# Patient Record
Sex: Female | Born: 1985 | Hispanic: Yes | Marital: Married | Smoking: Never smoker
Health system: Southern US, Community
[De-identification: ages and names within clinical notes are randomized; demographics above are authoritative.]

## PROBLEM LIST (undated history)

## (undated) ENCOUNTER — Emergency Department (HOSPITAL_COMMUNITY): Admission: EM | Payer: Medicaid Other | Source: Home / Self Care

---

## 2012-12-06 ENCOUNTER — Emergency Department (HOSPITAL_COMMUNITY): Payer: Medicaid Other

## 2012-12-06 ENCOUNTER — Encounter (HOSPITAL_COMMUNITY): Payer: Self-pay | Admitting: Emergency Medicine

## 2012-12-06 ENCOUNTER — Emergency Department (HOSPITAL_COMMUNITY)
Admission: EM | Admit: 2012-12-06 | Discharge: 2012-12-06 | Disposition: A | Discharge status: Home / Self Care | Payer: Medicaid Other | Attending: Emergency Medicine | Admitting: Emergency Medicine

## 2012-12-06 DIAGNOSIS — R110 Nausea: Secondary | ICD-10-CM | POA: Insufficient documentation

## 2012-12-06 DIAGNOSIS — R61 Generalized hyperhidrosis: Secondary | ICD-10-CM | POA: Insufficient documentation

## 2012-12-06 DIAGNOSIS — R630 Anorexia: Secondary | ICD-10-CM | POA: Insufficient documentation

## 2012-12-06 DIAGNOSIS — R51 Headache: Secondary | ICD-10-CM | POA: Insufficient documentation

## 2012-12-06 DIAGNOSIS — E049 Nontoxic goiter, unspecified: Secondary | ICD-10-CM | POA: Insufficient documentation

## 2012-12-06 DIAGNOSIS — H539 Unspecified visual disturbance: Secondary | ICD-10-CM | POA: Insufficient documentation

## 2012-12-06 DIAGNOSIS — E010 Iodine-deficiency related diffuse (endemic) goiter: Secondary | ICD-10-CM

## 2012-12-06 DIAGNOSIS — N926 Irregular menstruation, unspecified: Secondary | ICD-10-CM | POA: Insufficient documentation

## 2012-12-06 DIAGNOSIS — Z3202 Encounter for pregnancy test, result negative: Secondary | ICD-10-CM | POA: Insufficient documentation

## 2012-12-06 DIAGNOSIS — R5381 Other malaise: Secondary | ICD-10-CM | POA: Insufficient documentation

## 2012-12-06 DIAGNOSIS — R634 Abnormal weight loss: Secondary | ICD-10-CM

## 2012-12-06 DIAGNOSIS — M542 Cervicalgia: Secondary | ICD-10-CM | POA: Insufficient documentation

## 2012-12-06 DIAGNOSIS — L659 Nonscarring hair loss, unspecified: Secondary | ICD-10-CM | POA: Insufficient documentation

## 2012-12-06 DIAGNOSIS — K5900 Constipation, unspecified: Secondary | ICD-10-CM | POA: Insufficient documentation

## 2012-12-06 DIAGNOSIS — R6889 Other general symptoms and signs: Secondary | ICD-10-CM | POA: Insufficient documentation

## 2012-12-06 LAB — TSH: TSH: 2.077 u[IU]/mL (ref 0.350–4.500)

## 2012-12-06 LAB — COMPREHENSIVE METABOLIC PANEL
ALT: 11 U/L (ref 0–35)
Calcium: 9.7 mg/dL (ref 8.4–10.5)
Creatinine, Ser: 0.67 mg/dL (ref 0.50–1.10)
GFR calc Af Amer: >90 mL/min (ref >90)
Glucose, Bld: 89 mg/dL (ref 70–99)
Sodium: 135 mEq/L (ref 135–145)
Total Protein: 7.6 g/dL (ref 6.0–8.3)

## 2012-12-06 LAB — CBC WITH DIFFERENTIAL/PLATELET
Eosinophils Absolute: 0.1 10*3/uL (ref 0.0–0.7)
Eosinophils Relative: 1 % (ref 0–5)
Lymphs Abs: 2.4 10*3/uL (ref 0.7–4.0)
MCH: 30.9 pg (ref 26.0–34.0)
MCV: 92.7 fL (ref 78.0–100.0)
Monocytes Absolute: 0.4 10*3/uL (ref 0.1–1.0)
Platelets: 197 10*3/uL (ref 150–400)
RDW: 13.4 % (ref 11.5–15.5)

## 2012-12-06 LAB — T4, FREE: Free T4: 1.41 ng/dL (ref 0.80–1.80)

## 2012-12-06 LAB — RAPID HIV SCREEN (WH-MAU): Rapid HIV Screen: NONREACTIVE

## 2012-12-06 LAB — POCT PREGNANCY, URINE: Preg Test, Ur: NEGATIVE

## 2012-12-06 LAB — URINALYSIS, ROUTINE W REFLEX MICROSCOPIC
Bilirubin Urine: NEGATIVE
Hgb urine dipstick: NEGATIVE
Protein, ur: NEGATIVE mg/dL
Urobilinogen, UA: 1 mg/dL (ref 0.0–1.0)

## 2012-12-06 LAB — URINE MICROSCOPIC-ADD ON

## 2012-12-06 NOTE — ED Notes (Signed)
CBG 97 

## 2012-12-06 NOTE — ED Notes (Signed)
Patient transported to X-ray 

## 2012-12-06 NOTE — ED Provider Notes (Signed)
History     CSN: [id number]  Arrival date & time 12/06/12  1440   First MD Initiated Contact with Patient 12/06/12 1540      Chief Complaint  Patient presents with  . Sore Throat  . Night Sweats  . Weight Loss    (Consider location/radiation/quality/duration/timing/severity/associated sxs/prior treatment) Patient is a 26 y.o. female presenting with pharyngitis. The history is provided by the patient and medical records.  Sore Throat Associated symptoms include fatigue, nausea, neck pain and a sore throat. Pertinent negatives include no abdominal pain, arthralgias, chest pain, coughing, diaphoresis, fever, headaches, myalgias, numbness, rash, vomiting or weakness.    Michelle Best is a 26 y.o. female  with No PMHx presents to the Emergency Department complaining of gradual, persistent, progressively worsening night sweats, weight loss onset more than 2 lbs. Associated symptoms include night sweats nightly, 23 lb weight loss in 2 mos, sore throat x 1 week, hair loss, headaches in the mornings, nausea, mild constipation, cold intolerance, fatigue, anorexia.  Nothing makes it better and nothing makes it worse.  Pt denies fever, chills,  Chest pain, shortness of breath, cough, abdominal pain, vomiting, diarrhea, palpitations, weakness, numbness, tingling, loss of sensation, rashes, dysuria, hematuria, urgency, frequency, melena, BRBPR, polydipsia, polyphagia.  Pt states menstrual cycle was very regular until October 2013 when it became irregular and heavier than previously. G2P2.  Negative STD and HIV screens during her pregnancies.  Pt is not taking a birth control.  Pt traveled outside the [place] at the end of September to the [place] and stayed at a resort there.      History reviewed. No pertinent past medical history.  History reviewed. No pertinent past surgical history.  No family history on file.  History  Substance Use Topics  . Smoking status: Never Smoker   .  Smokeless tobacco: Never Used  . Alcohol Use: No    OB History    Grav Para Term Preterm Abortions TAB SAB Ect Mult Living                  Review of Systems  Constitutional: Positive for appetite change, fatigue and unexpected weight change. Negative for fever and diaphoresis.  HENT: Positive for sore throat and neck pain. Negative for mouth sores and neck stiffness.   Eyes: Positive for visual disturbance (decreased visual acuity ).  Respiratory: Negative for cough, chest tightness, shortness of breath and wheezing.   Cardiovascular: Negative for chest pain.  Gastrointestinal: Positive for nausea. Negative for vomiting, abdominal pain, diarrhea and constipation.  Genitourinary: Positive for menstrual problem (irregularity). Negative for dysuria, urgency, frequency, hematuria, decreased urine volume, vaginal bleeding, vaginal discharge, difficulty urinating and vaginal pain.  Musculoskeletal: Negative for myalgias, back pain and arthralgias.  Skin: Negative for rash.  Neurological: Negative for syncope, weakness, light-headedness, numbness and headaches.  Hematological: Negative for adenopathy. Does not bruise/bleed easily.  Psychiatric/Behavioral: Negative for confusion and sleep disturbance. The patient is not nervous/anxious.   All other systems reviewed and are negative.    Allergies  Review of patient's allergies indicates no known allergies.  Home Medications  No current outpatient prescriptions on file.  BP 109/67  Pulse 70  Temp 98.5 F (36.9 C) (Oral)  Resp 14  Ht 5\' 5"  (1.651 m)  Wt 106 lb (48.081 kg)  BMI 17.64 kg/m2  SpO2 99%  LMP 11/18/2012  Physical Exam  Nursing note and vitals reviewed. Constitutional: She is oriented to person, place, and time. She appears well-developed and well-nourished.  No distress.  HENT:  Head: Normocephalic and atraumatic.  Right Ear: Tympanic membrane, external ear and ear canal normal.  Left Ear: Tympanic membrane, external  ear and ear canal normal.  Nose: Nose normal. No mucosal edema or rhinorrhea. Right sinus exhibits no maxillary sinus tenderness and no frontal sinus tenderness. Left sinus exhibits no maxillary sinus tenderness and no frontal sinus tenderness.  Mouth/Throat: Uvula is midline, oropharynx is clear and moist and mucous membranes are normal. Mucous membranes are not pale, not dry and not cyanotic. No oral lesions. No uvula swelling. No oropharyngeal exudate, posterior oropharyngeal edema, posterior oropharyngeal erythema or tonsillar abscesses.  Eyes: Conjunctivae normal and EOM are normal. Pupils are equal, round, and reactive to light. No scleral icterus.  Neck: Normal range of motion, full passive range of motion without pain and phonation normal. Neck supple. Normal carotid pulses, no hepatojugular reflux and no JVD present. No tracheal tenderness, no spinous process tenderness and no muscular tenderness present. Carotid bruit is not present. No rigidity. No tracheal deviation, no edema, no erythema and normal range of motion present. No Brudzinski's sign and no Kernig's sign noted. Thyromegaly present. No mass present.  Cardiovascular: Normal rate, regular rhythm, S1 normal, S2 normal, normal heart sounds and intact distal pulses.  PMI is not displaced.   No murmur heard. Pulses:      Radial pulses are 2+ on the right side, and 2+ on the left side.       Dorsalis pedis pulses are 2+ on the right side, and 2+ on the left side.       Posterior tibial pulses are 2+ on the right side, and 2+ on the left side.  Pulmonary/Chest: Effort normal and breath sounds normal. No accessory muscle usage or stridor. Not tachypneic. No respiratory distress. She has no decreased breath sounds. She has no wheezes. She has no rhonchi. She has no rales.  Abdominal: Soft. Normal appearance and bowel sounds are normal. She exhibits no distension, no abdominal bruit, no pulsatile midline mass and no mass. There is no  hepatosplenomegaly. There is no tenderness. There is no rigidity, no rebound, no guarding, no CVA tenderness, no tenderness at McBurney's point and negative Murphy's sign.  Musculoskeletal: Normal range of motion. She exhibits no edema and no tenderness.  Neurological: She is alert and oriented to person, place, and time. She has normal reflexes. She displays normal reflexes. No cranial nerve deficit. She exhibits normal muscle tone. Coordination normal.       Speech is clear and goal oriented, follows commands Major Cranial nerves without deficit, Normal strength in upper and lower extremities bilaterally including dorsiflexion and plantar flexion, strong and equal grip strength Sensation normal to light and sharp touch Moves extremities without ataxia, coordination intact Normal finger to nose and rapid alternating movements Normal gait Normal heel-shin and balance  Skin: Skin is warm and dry. No rash noted. She is not diaphoretic. No erythema. No pallor.  Psychiatric: She has a normal mood and affect. Her speech is normal and behavior is normal. Thought content normal. Her mood appears not anxious.    ED Course  Procedures (including critical care time)  Labs Reviewed  URINALYSIS, ROUTINE W REFLEX MICROSCOPIC - Abnormal; Notable for the following:    Leukocytes, UA SMALL (*)     All other components within normal limits  COMPREHENSIVE METABOLIC PANEL - Abnormal; Notable for the following:    Total Bilirubin 0.2 (*)     All other components within normal limits  CBC WITH DIFFERENTIAL  RAPID HIV SCREEN ([facility])  POCT PREGNANCY, URINE  GLUCOSE, CAPILLARY  URINE MICROSCOPIC-ADD ON  TSH  T4, FREE   Dg Chest 2 View  12/06/2012  *RADIOLOGY REPORT*  Clinical Data: Weight loss, night sweats, International travel  CHEST - 2 VIEW  Comparison: None.  Findings: The lungs are well-aerated and free from pulmonary edema, focal airspace consolidation or pulmonary nodule.  Cardiac and mediastinal  contours are within normal limits.  Specifically, no evidence of mediastinal or hilar adenopathy by conventional radiography.  No pneumothorax, or pleural effusion.  No acute osseous findings.  IMPRESSION:  Normal chest x-ray.  Specifically, no evidence of mediastinal or hilar adenopathy by conventional radiography.   Original Report Authenticated By: Malachy Moan, M.D.      1. Thyromegaly   2. Weight loss   3. Night sweats   4. Anorexia       MDM  Tayra Dawe is with complaints of anorexia, night sweats, weight loss, menstrual cycle disruption all after international travel.  Concern for thyroid disorder patient also endorses or loss but does not endorse palpitations.  Also concern for diabetes the patient does not endorse polydipsia polyuria or polyphagia.  Concern for TB and HIV the patient was tested during her pregnancies and was negative;  1 sexual partner and has no history of IV drug use.  Also concern for malignancy as patient is endorsing night sweats and other B cell symptoms. Will obtain labwork and imaging.  Rapid HIV screen negative, CMP unremarkable, urine unremarkable, CBC unremarkable, CBG 97 and pregnancy test negative.  Chest x-ray without evidence of pneumothorax pleural effusion or mediastinal/hilar adenopathy.  This time is no evidence for diabetes, HIV, TB or malignancy.  TSH and free T4 still processing.  Discussed at length the need for followup with the patient and she is to find a primary care physician.  Will notify her of her thyroid results.  1. Medications: usual home medications 2. Treatment: rest, drink plenty of fluids, eat if you can 3. Follow Up: Please followup with your primary doctor for discussion of your diagnoses and further evaluation after today's visit; if you do not have a primary care doctor use the resource guide provided to find one       Dierdre Forth, PA-C 12/06/12 1802

## 2012-12-06 NOTE — ED Provider Notes (Signed)
Medical screening examination/treatment/procedure(s) were performed by non-physician practitioner and as supervising physician I was immediately available for consultation/collaboration.  Hyrum Shaneyfelt T Wiktoria Hemrick, MD 12/06/12 2209 

## 2012-12-06 NOTE — ED Notes (Signed)
Pt presents w/ persistant sore throat, night sweats, states she has lost 18 lbs during this period of time. Wakes during noc and has to put on dry clothes. Tired all the time and states her hair is starting to fall out.

## 2013-03-30 ENCOUNTER — Encounter (HOSPITAL_COMMUNITY): Payer: Self-pay | Admitting: Emergency Medicine

## 2013-03-30 ENCOUNTER — Emergency Department (HOSPITAL_COMMUNITY)
Admission: EM | Admit: 2013-03-30 | Discharge: 2013-03-30 | Disposition: A | Discharge status: Home / Self Care | Payer: Medicaid Other | Attending: Emergency Medicine | Admitting: Emergency Medicine

## 2013-03-30 DIAGNOSIS — W57XXXA Bitten or stung by nonvenomous insect and other nonvenomous arthropods, initial encounter: Secondary | ICD-10-CM | POA: Insufficient documentation

## 2013-03-30 DIAGNOSIS — Y92009 Unspecified place in unspecified non-institutional (private) residence as the place of occurrence of the external cause: Secondary | ICD-10-CM | POA: Insufficient documentation

## 2013-03-30 DIAGNOSIS — Y9389 Activity, other specified: Secondary | ICD-10-CM | POA: Insufficient documentation

## 2013-03-30 DIAGNOSIS — L089 Local infection of the skin and subcutaneous tissue, unspecified: Secondary | ICD-10-CM

## 2013-03-30 MED ORDER — HYDROCORTISONE 1 % EX CREA: TOPICAL_CREAM | CUTANEOUS | Status: DC

## 2013-03-30 MED ORDER — SULFAMETHOXAZOLE-TRIMETHOPRIM 800-160 MG PO TABS: 1.0000 | ORAL_TABLET | Freq: Two times a day (BID) | ORAL | Status: DC

## 2013-03-30 NOTE — ED Notes (Signed)
Lt leg noticed a small read bump apporx 5 days ago and then it became worse and causing pain to leg.

## 2013-03-30 NOTE — ED Notes (Signed)
Pt complains of "a bug bit me about 4 days ago and now it is spreading"

## 2013-03-30 NOTE — ED Provider Notes (Signed)
History     CSN: [id number]  Arrival date & time 03/30/13  1339   First MD Initiated Contact with Patient 03/30/13 1433      Chief Complaint  Patient presents with  . Insect Bite    (Consider location/radiation/quality/duration/timing/severity/associated sxs/prior treatment) HPI  Patient to the ER for rash to left knee. She feels as though she was bite by a spider. She has multiple tiny red dots and describes them as being really painful. The incident happened 4 days ago and she woke up with them. She shares a bed with her husband who does not have any bites nor her kids. She does not see any spiders in her bed. No other areas have been affected. No loss of function of her leg. Fevers, n,v,d, weakness, headaches. nad vss  History reviewed. No pertinent past medical history.  History reviewed. No pertinent past surgical history.  No family history on file.  History  Substance Use Topics  . Smoking status: Never Smoker   . Smokeless tobacco: Never Used  . Alcohol Use: No    OB History   Grav Para Term Preterm Abortions TAB SAB Ect Mult Living                  Review of Systems  All other systems reviewed and are negative.    Allergies  Review of patient's allergies indicates no known allergies.  Home Medications   Current Outpatient Rx  Name  Route  Sig  Dispense  Refill  . ibuprofen (ADVIL,MOTRIN) 200 MG tablet   Oral   Take 200 mg by mouth every 6 (six) hours as needed (menstrual cramps.).         Marland Kitchen Polyethyl Glycol-Propyl Glycol (SYSTANE OP)   Ophthalmic   Apply 1 drop to eye daily as needed (dry eyes.).         Marland Kitchen hydrocortisone cream 1 %      Apply to affected area 2 times daily   15 g   0   . sulfamethoxazole-trimethoprim (SEPTRA DS) 800-160 MG per tablet   Oral   Take 1 tablet by mouth every 12 (twelve) hours.   14 tablet   0     BP 94/63  Pulse 88  Temp(Src) 97.5 F (36.4 C) (Oral)  Resp 18  SpO2 99%  Physical Exam  Nursing note  and vitals reviewed. Constitutional: She appears well-developed and well-nourished. No distress.  HENT:  Head: Normocephalic and atraumatic.  Eyes: Pupils are equal, round, and reactive to light.  Neck: Normal range of motion. Neck supple.  Cardiovascular: Normal rate and regular rhythm.   Pulmonary/Chest: Effort normal.  Abdominal: Soft.  Neurological: She is alert.  Skin: Skin is warm and dry.       ED Course  Procedures (including critical care time)  Labs Reviewed - No data to display No results found.   1. Insect bite of leg, left, infected, initial encounter       MDM  abx and hydrocortisone cream.  Referral to Derm.  Pt has been advised of the symptoms that warrant their return to the ED. Patient has voiced understanding and has agreed to follow-up with the PCP or specialist.         Dorthula Matas, PA-C 03/30/13 1515

## 2013-04-01 NOTE — ED Provider Notes (Signed)
Medical screening examination/treatment/procedure(s) were performed by non-physician practitioner and as supervising physician I was immediately available for consultation/collaboration.   Suzi Roots, MD 04/01/13 2151

## 2013-05-09 ENCOUNTER — Encounter (HOSPITAL_COMMUNITY): Payer: Self-pay | Admitting: Emergency Medicine

## 2013-05-09 ENCOUNTER — Emergency Department (HOSPITAL_COMMUNITY)
Admission: EM | Admit: 2013-05-09 | Discharge: 2013-05-09 | Disposition: A | Discharge status: Home / Self Care | Payer: Medicaid Other | Attending: Emergency Medicine | Admitting: Emergency Medicine

## 2013-05-09 ENCOUNTER — Telehealth (HOSPITAL_COMMUNITY): Payer: Self-pay | Admitting: Emergency Medicine

## 2013-05-09 DIAGNOSIS — R630 Anorexia: Secondary | ICD-10-CM | POA: Insufficient documentation

## 2013-05-09 DIAGNOSIS — R42 Dizziness and giddiness: Secondary | ICD-10-CM | POA: Insufficient documentation

## 2013-05-09 DIAGNOSIS — R112 Nausea with vomiting, unspecified: Secondary | ICD-10-CM | POA: Insufficient documentation

## 2013-05-09 MED ORDER — MECLIZINE HCL 50 MG PO TABS: 50.0000 mg | ORAL_TABLET | Freq: Three times a day (TID) | ORAL | Status: DC | PRN

## 2013-05-09 NOTE — ED Provider Notes (Signed)
History     CSN: [id number]  Arrival date & time 05/09/13  1550   First MD Initiated Contact with Patient 05/09/13 1649      Chief Complaint  Patient presents with  . Dizziness    (Consider location/radiation/quality/duration/timing/severity/associated sxs/prior treatment) HPI Patient complaining of vertigo since Wednesday.  States feels like staggering.  Denies injury, states had headache last night but none when started or today.  States began when watching tv Wednesday.  Noted living room spinning when she tried to walk.  Patient states nausea and vomited four times Thursday, none Friday, today "dry heaving"  States taking water and gingerale but states hasn't eaten since yesterday due to poor appetite.  Denies ear pain, nasal congestion visual changes or lateralized weakness.    History reviewed. No pertinent past medical history.  History reviewed. No pertinent past surgical history.  History reviewed. No pertinent family history.  History  Substance Use Topics  . Smoking status: Never Smoker   . Smokeless tobacco: Never Used  . Alcohol Use: No    OB History   Grav Para Term Preterm Abortions TAB SAB Ect Mult Living                  Review of Systems  All other systems reviewed and are negative.    Allergies  Review of patient's allergies indicates no known allergies.  Home Medications  No current outpatient prescriptions on file.  BP 119/73  Pulse 83  Temp(Src) 98.6 F (37 C) (Oral)  Resp 20  SpO2 96%  LMP 04/26/2013  Physical Exam  Nursing note and vitals reviewed. Constitutional: She is oriented to person, place, and time. She appears well-developed and well-nourished.  HENT:  Head: Normocephalic and atraumatic.  Right Ear: Tympanic membrane and external ear normal.  Left Ear: Tympanic membrane and external ear normal.  Nose: Nose normal. Right sinus exhibits no maxillary sinus tenderness and no frontal sinus tenderness. Left sinus exhibits no  maxillary sinus tenderness and no frontal sinus tenderness.  Eyes: Conjunctivae and EOM are normal. Pupils are equal, round, and reactive to light. Right eye exhibits no nystagmus. Left eye exhibits no nystagmus.  Neck: Normal range of motion. Neck supple.  Cardiovascular: Normal rate, regular rhythm, normal heart sounds and intact distal pulses.   Pulmonary/Chest: Effort normal and breath sounds normal. No respiratory distress. She exhibits no tenderness.  Abdominal: Soft. Bowel sounds are normal. She exhibits no distension and no mass. There is no tenderness.  Musculoskeletal: Normal range of motion. She exhibits no edema and no tenderness.  Neurological: She is alert and oriented to person, place, and time. She has normal strength and normal reflexes. No sensory deficit. She displays a negative Romberg sign. GCS eye subscore is 4. GCS verbal subscore is 5. GCS motor subscore is 6.  Reflex Scores:      Tricep reflexes are 2+ on the right side and 2+ on the left side.      Bicep reflexes are 2+ on the right side and 2+ on the left side.      Brachioradialis reflexes are 2+ on the right side and 2+ on the left side.      Patellar reflexes are 2+ on the right side and 2+ on the left side.      Achilles reflexes are 2+ on the right side and 2+ on the left side. Patient with normal gait without ataxia, shuffling, spasm, or antalgia. Speech is normal without dysarthria, dysphasia, or aphasia. Muscle strength  is 5/5 in bilateral shoulders, elbow flexor and extensors, wrist flexor and extensors, and intrinsic hand muscles. 5/5 bilateral lower extremity hip flexors, extensors, knee flexors and extensors, and ankle dorsi and plantar flexors.    Skin: Skin is warm and dry. No rash noted.  Psychiatric: She has a normal mood and affect. Her behavior is normal. Judgment and thought content normal.    ED Course  Procedures (including critical care time)  Labs Reviewed - No data to display No results  found.   No diagnosis found.    MDM  lmp 5/2 lighter than usual g2p2  Discussed vertigo with patient and to follow up with pmd.  Plan antivert       Michelle Quarry, MD 05/09/13 [phone]

## 2013-05-09 NOTE — ED Notes (Signed)
She c/o waxing/waning vertigo with nausea with some vomiting (Thurs.), plus "dry-heaving" today.  She is in no distress.

## 2013-09-14 ENCOUNTER — Emergency Department (HOSPITAL_COMMUNITY)
Admission: EM | Admit: 2013-09-14 | Discharge: 2013-09-14 | Disposition: A | Discharge status: Home / Self Care | Payer: Medicaid Other | Attending: Emergency Medicine | Admitting: Emergency Medicine

## 2013-09-14 ENCOUNTER — Encounter (HOSPITAL_COMMUNITY): Payer: Self-pay

## 2013-09-14 DIAGNOSIS — R5383 Other fatigue: Secondary | ICD-10-CM

## 2013-09-14 DIAGNOSIS — R61 Generalized hyperhidrosis: Secondary | ICD-10-CM | POA: Insufficient documentation

## 2013-09-14 DIAGNOSIS — R630 Anorexia: Secondary | ICD-10-CM

## 2013-09-14 DIAGNOSIS — R5381 Other malaise: Secondary | ICD-10-CM | POA: Insufficient documentation

## 2013-09-14 DIAGNOSIS — Z3202 Encounter for pregnancy test, result negative: Secondary | ICD-10-CM | POA: Insufficient documentation

## 2013-09-14 LAB — CBC WITH DIFFERENTIAL/PLATELET
Basophils Absolute: 0 10*3/uL (ref 0.0–0.1)
Basophils Relative: 0 % (ref 0–1)
Eosinophils Absolute: 0 10*3/uL (ref 0.0–0.7)
Eosinophils Relative: 1 % (ref 0–5)
HCT: 41.1 % (ref 36.0–46.0)
Hemoglobin: 14.1 g/dL (ref 12.0–15.0)
MCH: 32.2 pg (ref 26.0–34.0)
MCHC: 34.3 g/dL (ref 30.0–36.0)
MCV: 93.8 fL (ref 78.0–100.0)
Monocytes Absolute: 0.4 10*3/uL (ref 0.1–1.0)
Monocytes Relative: 7 % (ref 3–12)
RDW: 13.1 % (ref 11.5–15.5)

## 2013-09-14 LAB — POCT I-STAT, CHEM 8
BUN: 9 mg/dL (ref 6–23)
Chloride: 110 mEq/L (ref 96–112)
Sodium: 143 mEq/L (ref 135–145)
TCO2: 23 mmol/L (ref 0–100)

## 2013-09-14 LAB — URINALYSIS, ROUTINE W REFLEX MICROSCOPIC
Glucose, UA: NEGATIVE mg/dL
Leukocytes, UA: NEGATIVE
Protein, ur: NEGATIVE mg/dL
pH: 5 (ref 5.0–8.0)

## 2013-09-14 LAB — URINE MICROSCOPIC-ADD ON

## 2013-09-14 LAB — POCT PREGNANCY, URINE: Preg Test, Ur: NEGATIVE

## 2013-09-14 NOTE — ED Provider Notes (Signed)
CSN: [id number]     Arrival date & time 09/14/13  1526 History   First MD Initiated Contact with Patient 09/14/13 1622     Chief Complaint  Patient presents with  . Fatigue  . Night Sweats   (Consider location/radiation/quality/duration/timing/severity/associated sxs/prior Treatment) HPI Comments: Patient presents with a chief complaint of fatigue.  She reports that she has been feeling fatigued for the past 2-3 years.  She also reports that she has had intermittent night sweats over the past 2-3 years.  She states that she has seen 4-5 different physician regarding this in the past, but has not been given a diagnosis.  She states that at one time her thyroid was checked and the TSH was elevated.  However, they rechecked it and it was normal.  She is currently not on any thyroid medications at this time.  She denies cough, fever, chills, nausea, vomiting, abdominal pain, chest pain, or SOB at this time.  Denies any pain at this time.  She is not taking anything for her symptoms.    The history is provided by the patient.    History reviewed. No pertinent past medical history. History reviewed. No pertinent past surgical history. No family history on file. History  Substance Use Topics  . Smoking status: Never Smoker   . Smokeless tobacco: Never Used  . Alcohol Use: No   OB History   Grav Para Term Preterm Abortions TAB SAB Ect Mult Living                 Review of Systems  Constitutional: Positive for appetite change and fatigue.  All other systems reviewed and are negative.    Allergies  Review of patient's allergies indicates no known allergies.  Home Medications   Current Outpatient Rx  Name  Route  Sig  Dispense  Refill  . ibuprofen (ADVIL,MOTRIN) 200 MG tablet   Oral   Take 200 mg by mouth every 6 (six) hours as needed for pain.          BP 115/76  Pulse 98  Temp(Src) 97.3 F (36.3 C) (Oral)  Resp 20  SpO2 100%  LMP 09/07/2013 Physical Exam  Nursing note  and vitals reviewed. Constitutional: She is oriented to person, place, and time. She appears well-developed and well-nourished.  Non-toxic appearance. She does not have a sickly appearance. She does not appear ill.  HENT:  Head: Normocephalic and atraumatic.  Mouth/Throat: Oropharynx is clear and moist.  Eyes: EOM are normal. Pupils are equal, round, and reactive to light.  Neck: Normal range of motion. Neck supple.  Cardiovascular: Normal rate, regular rhythm and normal heart sounds.   Pulmonary/Chest: Effort normal and breath sounds normal. No respiratory distress. She has no wheezes. She has no rales.  Abdominal: Soft. Bowel sounds are normal. She exhibits no distension. There is no tenderness. There is no rebound and no guarding.  Musculoskeletal: Normal range of motion.  Neurological: She is alert and oriented to person, place, and time. She has normal strength. No cranial nerve deficit or sensory deficit. Coordination normal.  Skin: Skin is warm and dry.  Psychiatric: She has a normal mood and affect.    ED Course  Procedures (including critical care time) Labs Review Labs Reviewed  POCT I-STAT, CHEM 8 - Abnormal; Notable for the following:    Calcium, Ion 1.26 (*)    Hemoglobin 15.3 (*)    All other components within normal limits  CBC WITH DIFFERENTIAL  URINALYSIS, ROUTINE W REFLEX  MICROSCOPIC  POCT PREGNANCY, URINE   Imaging Review No results found.  MDM  No diagnosis found. Patient presenting with a chief complaint of fatigue that has been present for the past 2-3 years.  She states that she has seen 4-5 physicians for this in the past, but has not been given a diagnosis.  Patient appears well and non toxic on exam.  Vital signs stable.  Physical exam unremarkable.  Labs unremarkable.  Feel that the patient is stable for discharge.  Patient instructed to follow up with PCP.  Return precautions given.      Pascal Lux [place], PA-C 09/16/13 0002

## 2013-09-14 NOTE — Progress Notes (Signed)
[facility] ED CM noted pt with medicaid coverage but no pcp listed Spoke with pt who confirms no pcp  [facility] ED CM spoke with pt on how to obtain an in network pcp with insurance coverage via the customer service number or web site     CM discussed with pt the need to verify with DSS who is pcp is, how to request a change of medicaid pcp and process for changing medicaid from county to county and from state to state Pt voiced understanding   CM provided pt with a list of [place] accepting [facility] pcps Pt appreciative of resources

## 2013-09-14 NOTE — ED Notes (Signed)
Pt c/o fatigue, night sweats, decreased appetite for "years", states was told she may have thyroid dx

## 2013-09-16 NOTE — ED Provider Notes (Signed)
Medical screening examination/treatment/procedure(s) were performed by non-physician practitioner and as supervising physician I was immediately available for consultation/collaboration.  Shon Baton, MD 09/16/13 1416

## 2015-03-11 ENCOUNTER — Emergency Department (HOSPITAL_BASED_OUTPATIENT_CLINIC_OR_DEPARTMENT_OTHER): Payer: Medicaid Other

## 2015-03-11 ENCOUNTER — Emergency Department (HOSPITAL_BASED_OUTPATIENT_CLINIC_OR_DEPARTMENT_OTHER)
Admission: EM | Admit: 2015-03-11 | Discharge: 2015-03-11 | Disposition: A | Discharge status: Home / Self Care | Payer: Medicaid Other | Attending: Emergency Medicine | Admitting: Emergency Medicine

## 2015-03-11 ENCOUNTER — Encounter (HOSPITAL_BASED_OUTPATIENT_CLINIC_OR_DEPARTMENT_OTHER): Payer: Self-pay

## 2015-03-11 DIAGNOSIS — Y92410 Unspecified street and highway as the place of occurrence of the external cause: Secondary | ICD-10-CM | POA: Insufficient documentation

## 2015-03-11 DIAGNOSIS — S0990XA Unspecified injury of head, initial encounter: Secondary | ICD-10-CM | POA: Insufficient documentation

## 2015-03-11 DIAGNOSIS — Y998 Other external cause status: Secondary | ICD-10-CM | POA: Insufficient documentation

## 2015-03-11 DIAGNOSIS — S8011XA Contusion of right lower leg, initial encounter: Secondary | ICD-10-CM | POA: Diagnosis not present

## 2015-03-11 DIAGNOSIS — Y9389 Activity, other specified: Secondary | ICD-10-CM | POA: Insufficient documentation

## 2015-03-11 DIAGNOSIS — S8991XA Unspecified injury of right lower leg, initial encounter: Secondary | ICD-10-CM | POA: Diagnosis present
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
## 2015-03-11 NOTE — ED Notes (Signed)
Report received from Marva Simms, care assumed. 

## 2015-03-11 NOTE — Discharge Instructions (Signed)
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
## 2016-10-25 ENCOUNTER — Encounter (HOSPITAL_BASED_OUTPATIENT_CLINIC_OR_DEPARTMENT_OTHER): Payer: Self-pay | Admitting: *Deleted

## 2016-10-25 ENCOUNTER — Emergency Department (HOSPITAL_BASED_OUTPATIENT_CLINIC_OR_DEPARTMENT_OTHER)
Admission: EM | Admit: 2016-10-25 | Discharge: 2016-10-25 | Disposition: A | Discharge status: Home / Self Care | Payer: Medicaid Other | Attending: Emergency Medicine | Admitting: Emergency Medicine

## 2016-10-25 DIAGNOSIS — L03317 Cellulitis of buttock: Secondary | ICD-10-CM | POA: Diagnosis not present

## 2016-10-25 DIAGNOSIS — L0231 Cutaneous abscess of buttock: Secondary | ICD-10-CM | POA: Diagnosis present

## 2016-10-25 MED ORDER — LIDOCAINE-EPINEPHRINE 1 %-1:100000 IJ SOLN: 10.0000 mL | Freq: Once | INTRAMUSCULAR | Status: DC

## 2016-10-25 MED ORDER — SULFAMETHOXAZOLE-TRIMETHOPRIM 800-160 MG PO TABS
1.0000 | ORAL_TABLET | Freq: Two times a day (BID) | ORAL | 0 refills | Status: DC
Start: 2016-10-25 — End: 2018-09-20

## 2016-10-25 MED ORDER — IBUPROFEN 800 MG PO TABS: 800.0000 mg | ORAL_TABLET | Freq: Three times a day (TID) | ORAL | 0 refills | Status: DC | PRN

## 2016-10-25 MED ORDER — LIDOCAINE-EPINEPHRINE (PF) 2 %-1:200000 IJ SOLN
10.0000 mL | Freq: Once | INTRAMUSCULAR | Status: AC
  Administered 2016-10-25: 10 mL via INTRADERMAL
  Filled 2016-10-25: qty 10

## 2016-10-25 NOTE — ED Provider Notes (Signed)
[facility]   CSN: [id number] Arrival date & time: 10/25/16  1044     History   Chief Complaint Chief Complaint  Patient presents with  . Abscess    HPI Michelle Best is a 30 y.o. female.  HPI   Patient presents with 4 days of right buttock pain and growing lesion.  States the lesion started out as a red dot that has gradually increased in size, "feels like there's something in there."  The pain radiates throughout her right buttock.  Has been putting skin cream on it without improvement.  Has also tried warm compresses.  Denies drainage.  Denies fevers, chills, myalgias, N/V, abdominal pain, bowel or bladder changes.    History reviewed. No pertinent past medical history.  There are no active problems to display for this patient.   History reviewed. No pertinent surgical history.  OB History    No data available       Home Medications    Prior to Admission medications   Medication Sig Start Date End Date Taking? Authorizing Provider  ibuprofen (ADVIL,MOTRIN) 800 MG tablet Take 1 tablet (800 mg total) by mouth every 8 (eight) hours as needed. 10/25/16   Trixie DredgeEmily Kjerstin Abrigo, PA-C  sulfamethoxazole-trimethoprim (BACTRIM DS,SEPTRA DS) 800-160 MG tablet Take 1 tablet by mouth 2 (two) times daily. X 7 days 10/25/16   Trixie DredgeEmily Ladrea Holladay, PA-C    Family History No family history on file.  Social History Social History  Substance Use Topics  . Smoking status: Never Smoker  . Smokeless tobacco: Never Used  . Alcohol use No     Allergies   Review of patient's allergies indicates no known allergies.   Review of Systems Review of Systems  All other systems reviewed and are negative.    Physical Exam Updated Vital Signs BP 98/65   Pulse 86   Temp 98 F (36.7 C) (Oral)   Resp 20   Ht 5\' 4"  (1.626 m)   Wt 49.9 kg   SpO2 100%   BMI 18.88 kg/m   Physical Exam  Constitutional: She appears well-developed and well-nourished. No distress.  HENT:    Head: Normocephalic and atraumatic.  Neck: Neck supple.  Pulmonary/Chest: Effort normal.  Neurological: She is alert.  Skin: She is not diaphoretic.  Right buttock with raised erythematous lesion with induration, warmth, tenderness, central tiny scab.    Nursing note and vitals reviewed.    ED Treatments / Results  Labs (all labs ordered are listed, but only abnormal results are displayed) Labs Reviewed - No data to display  EKG  EKG Interpretation None       Radiology No results found.  Procedures Procedures (including critical care time)  EMERGENCY DEPARTMENT US SOFT TISSUE INTERPRETATION "Study: Limited Ultrasound of the noted body part in comments below"  INDICATIONS: Pain and Soft tissue infection Multiple views of the body part are obtained with a multi-frequency linear probe  PERFORMED BY:  Myself  IMAGES ARCHIVED?: Yes  SIDE:Right   BODY PART:Other soft tisse (comment in note)  FINDINGS: Abcess present and Cellulitis present  LIMITATIONS:  Emergent Procedure  INTERPRETATION:  Abcess present  COMMENT:  Right buttock with small abscess with complex fluid collection     INCISION AND DRAINAGE Performed by: Trixie DredgeWEST, Monalisa Bayless Consent: Verbal consent obtained. Risks and benefits: risks, benefits and alternatives were discussed Type: abscess  Body area: right buttock  Anesthesia: local infiltration  Incision was made with a scalpel.  Local anesthetic: lidocaine 2% with  epinephrine  Anesthetic total: 3 ml  Complexity: complex Blunt dissection to break up loculations  Drainage: purulent  Drainage amount: moderate  Packing material: none  Flushed with normal saline.  Patient tolerance: Patient tolerated the procedure well with no immediate complications.     Medications Ordered in ED Medications  lidocaine-EPINEPHrine (XYLOCAINE W/EPI) 2 %-1:200000 (PF) injection 10 mL (10 mLs Intradermal Given 10/25/16 1137)     Initial Impression /  Assessment and Plan / ED Course  I have reviewed the triage vital signs and the nursing notes.  Pertinent labs & imaging results that were available during my care of the patient were reviewed by me and considered in my medical decision making (see chart for details).  Clinical Course    Afebrile, nontoxic patient with right buttock abscess with overlying cellulitis.  I&D in department.   D/C home with bactrim, motrin, home care instructions, return precautions.  Discussed result, findings, treatment, and follow up  with patient.  Pt given return precautions.  Pt verbalizes understanding and agrees with plan.       Final Clinical Impressions(s) / ED Diagnoses   Final diagnoses:  Abscess of buttock, right  Cellulitis of buttock    New Prescriptions New Prescriptions   IBUPROFEN (ADVIL,MOTRIN) 800 MG TABLET    Take 1 tablet (800 mg total) by mouth every 8 (eight) hours as needed.   SULFAMETHOXAZOLE-TRIMETHOPRIM (BACTRIM DS,SEPTRA DS) 800-160 MG TABLET    Take 1 tablet by mouth 2 (two) times daily. X 7 days     Trixie Dredgemily Lleyton Byers, PA-C 10/25/16 1209    Melene Planan Floyd, DO 10/25/16 1303

## 2016-10-25 NOTE — Discharge Instructions (Signed)
Read the information below.  Use the prescribed medication as directed.  Please discuss all new medications with your pharmacist.  You may return to the Emergency Department at any time for worsening condition or any new symptoms that concern you.    If you develop increased redness, swelling, worsening pain, or fevers greater than 100.4, return to the ER immediately for a recheck.   °

## 2016-10-25 NOTE — ED Triage Notes (Signed)
Abscess to her right buttock.

## 2018-09-19 ENCOUNTER — Emergency Department (HOSPITAL_BASED_OUTPATIENT_CLINIC_OR_DEPARTMENT_OTHER): Payer: Self-pay

## 2018-09-19 ENCOUNTER — Other Ambulatory Visit: Payer: Self-pay

## 2018-09-19 ENCOUNTER — Encounter (HOSPITAL_BASED_OUTPATIENT_CLINIC_OR_DEPARTMENT_OTHER): Payer: Self-pay | Admitting: Emergency Medicine

## 2018-09-19 ENCOUNTER — Emergency Department (HOSPITAL_BASED_OUTPATIENT_CLINIC_OR_DEPARTMENT_OTHER)
Admission: EM | Admit: 2018-09-19 | Discharge: 2018-09-20 | Disposition: A | Discharge status: Home / Self Care | Payer: Self-pay | Attending: Emergency Medicine | Admitting: Emergency Medicine

## 2018-09-19 DIAGNOSIS — S161XXA Strain of muscle, fascia and tendon at neck level, initial encounter: Secondary | ICD-10-CM | POA: Insufficient documentation

## 2018-09-19 DIAGNOSIS — W2210XA Striking against or struck by unspecified automobile airbag, initial encounter: Secondary | ICD-10-CM

## 2018-09-19 DIAGNOSIS — S298XXA Other specified injuries of thorax, initial encounter: Secondary | ICD-10-CM

## 2018-09-19 DIAGNOSIS — Y9389 Activity, other specified: Secondary | ICD-10-CM | POA: Insufficient documentation

## 2018-09-19 DIAGNOSIS — Y92410 Unspecified street and highway as the place of occurrence of the external cause: Secondary | ICD-10-CM | POA: Insufficient documentation

## 2018-09-19 DIAGNOSIS — S299XXA Unspecified injury of thorax, initial encounter: Secondary | ICD-10-CM | POA: Insufficient documentation

## 2018-09-19 DIAGNOSIS — Z23 Encounter for immunization: Secondary | ICD-10-CM | POA: Insufficient documentation

## 2018-09-19 DIAGNOSIS — Y999 Unspecified external cause status: Secondary | ICD-10-CM | POA: Insufficient documentation

## 2018-09-19 LAB — URINALYSIS, ROUTINE W REFLEX MICROSCOPIC
Bilirubin Urine: NEGATIVE
Glucose, UA: NEGATIVE mg/dL
KETONES UR: NEGATIVE mg/dL
LEUKOCYTES UA: NEGATIVE
NITRITE: NEGATIVE
PROTEIN: NEGATIVE mg/dL
Specific Gravity, Urine: >1.03 — ABNORMAL HIGH (ref 1.005–1.030)
pH: 6 (ref 5.0–8.0)

## 2018-09-19 LAB — URINALYSIS, MICROSCOPIC (REFLEX): WBC UA: NONE SEEN WBC/hpf (ref 0–5)

## 2018-09-19 LAB — PREGNANCY, URINE: PREG TEST UR: NEGATIVE

## 2018-09-19 NOTE — ED Notes (Addendum)
Pt brought back to [facility] 13 , pt is hyperventilating and c/o dizziness, appears to be anxious, pt is cursing at staff d/t wait time in lobby

## 2018-09-19 NOTE — ED Notes (Signed)
Patient transported to X-ray 
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
## 2018-09-20 ENCOUNTER — Emergency Department (HOSPITAL_BASED_OUTPATIENT_CLINIC_OR_DEPARTMENT_OTHER): Payer: Self-pay

## 2018-09-20 MED ORDER — NAPROXEN 250 MG PO TABS
500.0000 mg | ORAL_TABLET | Freq: Once | ORAL | Status: AC
  Administered 2018-09-20: 500 mg via ORAL
  Filled 2018-09-20: qty 2

## 2018-09-20 MED ORDER — NAPROXEN 375 MG PO TABS: ORAL_TABLET | ORAL | 0 refills | Status: AC

## 2018-09-20 MED ORDER — ONDANSETRON 8 MG PO TBDP
8.0000 mg | ORAL_TABLET | Freq: Once | ORAL | Status: AC
  Administered 2018-09-20: 8 mg via ORAL
  Filled 2018-09-20: qty 1

## 2018-09-20 MED ORDER — TETANUS-DIPHTH-ACELL PERTUSSIS 5-2.5-18.5 LF-MCG/0.5 IM SUSP
0.5000 mL | Freq: Once | INTRAMUSCULAR | Status: AC
  Administered 2018-09-20: 0.5 mL via INTRAMUSCULAR
  Filled 2018-09-20: qty 0.5

## 2018-09-20 NOTE — ED Notes (Signed)
ED Provider at bedside. 
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
## 2018-09-20 NOTE — ED Notes (Signed)
Patient transported to X-ray 
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
